# Patient Record
Sex: Male | Born: 1990 | Race: White | Hispanic: No | Marital: Single | Smoking: Current every day smoker
Health system: Southern US, Community
[De-identification: ages and names within clinical notes are randomized; demographics above are authoritative.]

---

## 2015-05-20 ENCOUNTER — Encounter (HOSPITAL_COMMUNITY): Payer: Self-pay

## 2015-05-20 ENCOUNTER — Emergency Department (HOSPITAL_COMMUNITY): Payer: Self-pay

## 2015-05-20 ENCOUNTER — Inpatient Hospital Stay (HOSPITAL_COMMUNITY)
Admission: EM | Admit: 2015-05-20 | Discharge: 2015-05-24 | DRG: 493 | Disposition: A | Discharge status: Home / Self Care | Payer: Self-pay | Attending: Orthopaedic Surgery | Admitting: Orthopaedic Surgery

## 2015-05-20 DIAGNOSIS — S82201A Unspecified fracture of shaft of right tibia, initial encounter for closed fracture: Secondary | ICD-10-CM | POA: Diagnosis present

## 2015-05-20 DIAGNOSIS — S0101XA Laceration without foreign body of scalp, initial encounter: Secondary | ICD-10-CM | POA: Diagnosis present

## 2015-05-20 DIAGNOSIS — S82209B Unspecified fracture of shaft of unspecified tibia, initial encounter for open fracture type I or II: Secondary | ICD-10-CM | POA: Diagnosis present

## 2015-05-20 DIAGNOSIS — S82401A Unspecified fracture of shaft of right fibula, initial encounter for closed fracture: Secondary | ICD-10-CM

## 2015-05-20 DIAGNOSIS — T79A21A Traumatic compartment syndrome of right lower extremity, initial encounter: Secondary | ICD-10-CM | POA: Diagnosis present

## 2015-05-20 DIAGNOSIS — Z880 Allergy status to penicillin: Secondary | ICD-10-CM

## 2015-05-20 DIAGNOSIS — T1490XA Injury, unspecified, initial encounter: Secondary | ICD-10-CM

## 2015-05-20 DIAGNOSIS — S82491A Other fracture of shaft of right fibula, initial encounter for closed fracture: Principal | ICD-10-CM | POA: Diagnosis present

## 2015-05-20 DIAGNOSIS — F17200 Nicotine dependence, unspecified, uncomplicated: Secondary | ICD-10-CM | POA: Diagnosis present

## 2015-05-20 LAB — CBC WITH DIFFERENTIAL/PLATELET
BASOS ABS: 0 10*3/uL (ref 0.0–0.1)
Basophils Relative: 0 %
EOS ABS: 0.4 10*3/uL (ref 0.0–0.7)
Eosinophils Relative: 4 %
HCT: 42.8 % (ref 39.0–52.0)
Hemoglobin: 14.7 g/dL (ref 13.0–17.0)
LYMPHS PCT: 32 %
Lymphs Abs: 3.5 10*3/uL (ref 0.7–4.0)
MCH: 32.1 pg (ref 26.0–34.0)
MCHC: 34.3 g/dL (ref 30.0–36.0)
MCV: 93.4 fL (ref 78.0–100.0)
Monocytes Absolute: 0.9 10*3/uL (ref 0.1–1.0)
Monocytes Relative: 8 %
NEUTROS ABS: 6.2 10*3/uL (ref 1.7–7.7)
NEUTROS PCT: 56 %
PLATELETS: 291 10*3/uL (ref 150–400)
RBC: 4.58 MIL/uL (ref 4.22–5.81)
RDW: 12.1 % (ref 11.5–15.5)
SMEAR REVIEW: ADEQUATE
WBC: 11 10*3/uL — ABNORMAL HIGH (ref 4.0–10.5)

## 2015-05-20 LAB — BASIC METABOLIC PANEL
Anion gap: 11 (ref 5–15)
BUN: 9 mg/dL (ref 6–20)
CALCIUM: 9.1 mg/dL (ref 8.9–10.3)
CO2: 25 mmol/L (ref 22–32)
CREATININE: 1.13 mg/dL (ref 0.61–1.24)
Chloride: 102 mmol/L (ref 101–111)
GFR calc Af Amer: >60 mL/min (ref >60)
Glucose, Bld: 109 mg/dL — ABNORMAL HIGH (ref 65–99)
POTASSIUM: 2.9 mmol/L — AB (ref 3.5–5.1)
SODIUM: 138 mmol/L (ref 135–145)

## 2015-05-20 LAB — PROTIME-INR
INR: 1.13 (ref 0.00–1.49)
PROTHROMBIN TIME: 14.7 s (ref 11.6–15.2)

## 2015-05-20 MED ORDER — MIDAZOLAM HCL 2 MG/2ML IJ SOLN
INTRAMUSCULAR | Status: AC
  Filled 2015-05-20: qty 2

## 2015-05-20 MED ORDER — TETANUS-DIPHTHERIA TOXOIDS TD 5-2 LFU IM INJ
0.5000 mL | INJECTION | Freq: Once | INTRAMUSCULAR | Status: AC
  Administered 2015-05-21: 0.5 mL via INTRAMUSCULAR
  Filled 2015-05-20: qty 0.5

## 2015-05-20 MED ORDER — MORPHINE SULFATE (PF) 4 MG/ML IV SOLN
6.0000 mg | Freq: Once | INTRAVENOUS | Status: AC
  Administered 2015-05-21: 6 mg via INTRAVENOUS
  Filled 2015-05-20: qty 2

## 2015-05-20 MED ORDER — PROPOFOL 10 MG/ML IV BOLUS
INTRAVENOUS | Status: AC
  Filled 2015-05-20: qty 20

## 2015-05-20 MED ORDER — FENTANYL CITRATE (PF) 250 MCG/5ML IJ SOLN
INTRAMUSCULAR | Status: AC
  Filled 2015-05-20: qty 5

## 2015-05-20 NOTE — Progress Notes (Signed)
Orthopedic Tech Progress Note Patient Details:  Hurley CiscoBrandon L Blakley 02/18/1991 [id number] Patient ID: Hurley CiscoBrandon L Eiben, male   DOB: 09/14/1990, 25 y.o.   MRN: [id number]  Level 2 trauma ortho visit. Jennye MoccasinHughes, Shaneil Yazdi Craig 05/20/2015, 10:31 PM
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
## 2015-05-20 NOTE — ED Notes (Signed)
MD at bedside. 
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
## 2015-05-21 ENCOUNTER — Emergency Department (HOSPITAL_COMMUNITY): Payer: Self-pay | Admitting: Certified Registered"

## 2015-05-21 ENCOUNTER — Inpatient Hospital Stay (HOSPITAL_COMMUNITY): Payer: Self-pay

## 2015-05-21 ENCOUNTER — Emergency Department (HOSPITAL_COMMUNITY): Payer: MEDICAID | Admitting: Certified Registered"

## 2015-05-21 ENCOUNTER — Encounter (HOSPITAL_COMMUNITY)
Admission: EM | Disposition: A | Discharge status: Home / Self Care | Payer: Self-pay | Source: Home / Self Care | Attending: Orthopaedic Surgery

## 2015-05-21 ENCOUNTER — Encounter (HOSPITAL_COMMUNITY): Payer: Self-pay | Admitting: Certified Registered"

## 2015-05-21 DIAGNOSIS — S82209B Unspecified fracture of shaft of unspecified tibia, initial encounter for open fracture type I or II: Secondary | ICD-10-CM | POA: Diagnosis present

## 2015-05-21 HISTORY — PX: FASCIOTOMY: SHX132

## 2015-05-21 HISTORY — PX: TIBIA IM NAIL INSERTION: SHX2516

## 2015-05-21 SURGERY — INSERTION, INTRAMEDULLARY ROD, TIBIA
Anesthesia: General | Site: Leg Lower | Laterality: Right

## 2015-05-21 MED ORDER — PROPOFOL 10 MG/ML IV BOLUS
INTRAVENOUS | Status: DC | PRN
  Administered 2015-05-21: 170 mg via INTRAVENOUS

## 2015-05-21 MED ORDER — LIDOCAINE HCL (CARDIAC) 20 MG/ML IV SOLN
INTRAVENOUS | Status: DC | PRN
  Administered 2015-05-21: 50 mg via INTRAVENOUS

## 2015-05-21 MED ORDER — SUCCINYLCHOLINE CHLORIDE 20 MG/ML IJ SOLN
INTRAMUSCULAR | Status: AC
  Filled 2015-05-21: qty 1

## 2015-05-21 MED ORDER — HYDROMORPHONE HCL 1 MG/ML IJ SOLN
INTRAMUSCULAR | Status: AC
  Filled 2015-05-21: qty 1

## 2015-05-21 MED ORDER — FENTANYL CITRATE (PF) 250 MCG/5ML IJ SOLN
INTRAMUSCULAR | Status: DC | PRN
  Administered 2015-05-21: 50 ug via INTRAVENOUS
  Administered 2015-05-21: 100 ug via INTRAVENOUS

## 2015-05-21 MED ORDER — METHOCARBAMOL 500 MG PO TABS
500.0000 mg | ORAL_TABLET | Freq: Four times a day (QID) | ORAL | Status: DC | PRN
  Administered 2015-05-21 – 2015-05-23 (×8): 500 mg via ORAL
  Filled 2015-05-21 (×9): qty 1

## 2015-05-21 MED ORDER — SUCCINYLCHOLINE CHLORIDE 20 MG/ML IJ SOLN
INTRAMUSCULAR | Status: DC | PRN
  Administered 2015-05-21: 160 mg via INTRAVENOUS

## 2015-05-21 MED ORDER — CEFAZOLIN SODIUM-DEXTROSE 2-4 GM/100ML-% IV SOLN: 2.0000 g | Freq: Once | INTRAVENOUS | Status: DC

## 2015-05-21 MED ORDER — HYDROMORPHONE HCL 1 MG/ML IJ SOLN
0.2500 mg | INTRAMUSCULAR | Status: DC | PRN
  Administered 2015-05-21 (×3): 0.5 mg via INTRAVENOUS

## 2015-05-21 MED ORDER — ONDANSETRON HCL 4 MG/2ML IJ SOLN
INTRAMUSCULAR | Status: DC | PRN
  Administered 2015-05-21: 4 mg via INTRAVENOUS

## 2015-05-21 MED ORDER — CLINDAMYCIN PHOSPHATE 900 MG/50ML IV SOLN
INTRAVENOUS | Status: DC | PRN
  Administered 2015-05-21: 900 mg via INTRAVENOUS

## 2015-05-21 MED ORDER — INFLUENZA VAC SPLIT QUAD 0.5 ML IM SUSY
0.5000 mL | PREFILLED_SYRINGE | INTRAMUSCULAR | Status: AC
  Administered 2015-05-24: 0.5 mL via INTRAMUSCULAR
  Filled 2015-05-21: qty 0.5

## 2015-05-21 MED ORDER — 0.9 % SODIUM CHLORIDE (POUR BTL) OPTIME
TOPICAL | Status: DC | PRN
  Administered 2015-05-21: 1000 mL

## 2015-05-21 MED ORDER — SODIUM CHLORIDE 0.9 % IV SOLN
INTRAVENOUS | Status: DC
  Administered 2015-05-21 – 2015-05-23 (×3): via INTRAVENOUS

## 2015-05-21 MED ORDER — ROCURONIUM BROMIDE 50 MG/5ML IV SOLN
INTRAVENOUS | Status: AC
Start: 2015-05-21 — End: 2015-05-21
  Filled 2015-05-21: qty 1

## 2015-05-21 MED ORDER — ACETAMINOPHEN 650 MG RE SUPP: 650.0000 mg | Freq: Four times a day (QID) | RECTAL | Status: DC | PRN

## 2015-05-21 MED ORDER — LACTATED RINGERS IV SOLN
INTRAVENOUS | Status: DC | PRN
  Administered 2015-05-21 (×2): via INTRAVENOUS

## 2015-05-21 MED ORDER — DEXAMETHASONE SODIUM PHOSPHATE 4 MG/ML IJ SOLN
INTRAMUSCULAR | Status: DC | PRN
  Administered 2015-05-21: 4 mg via INTRAVENOUS

## 2015-05-21 MED ORDER — OXYCODONE HCL 5 MG PO TABS
5.0000 mg | ORAL_TABLET | ORAL | Status: DC | PRN
  Administered 2015-05-21 – 2015-05-24 (×16): 10 mg via ORAL
  Filled 2015-05-21 (×16): qty 2

## 2015-05-21 MED ORDER — ACETAMINOPHEN 325 MG PO TABS
650.0000 mg | ORAL_TABLET | Freq: Four times a day (QID) | ORAL | Status: DC | PRN
  Administered 2015-05-22 – 2015-05-23 (×2): 650 mg via ORAL
  Filled 2015-05-21 (×2): qty 2

## 2015-05-21 MED ORDER — METHOCARBAMOL 1000 MG/10ML IJ SOLN: 500.0000 mg | Freq: Four times a day (QID) | INTRAVENOUS | Status: DC | PRN

## 2015-05-21 MED ORDER — ONDANSETRON HCL 4 MG/2ML IJ SOLN: 4.0000 mg | Freq: Once | INTRAMUSCULAR | Status: DC | PRN

## 2015-05-21 MED ORDER — NICOTINE 14 MG/24HR TD PT24
14.0000 mg | MEDICATED_PATCH | Freq: Every day | TRANSDERMAL | Status: DC
  Administered 2015-05-21 – 2015-05-24 (×4): 14 mg via TRANSDERMAL
  Filled 2015-05-21 (×4): qty 1

## 2015-05-21 MED ORDER — LIDOCAINE HCL (CARDIAC) 20 MG/ML IV SOLN
INTRAVENOUS | Status: AC
Start: 2015-05-21 — End: 2015-05-21
  Filled 2015-05-21: qty 5

## 2015-05-21 MED ORDER — CLINDAMYCIN PHOSPHATE 900 MG/50ML IV SOLN
INTRAVENOUS | Status: AC
  Filled 2015-05-21: qty 50

## 2015-05-21 MED ORDER — ONDANSETRON HCL 4 MG/2ML IJ SOLN
INTRAMUSCULAR | Status: AC
  Filled 2015-05-21: qty 2

## 2015-05-21 MED ORDER — DIPHENHYDRAMINE HCL 12.5 MG/5ML PO ELIX
12.5000 mg | ORAL_SOLUTION | ORAL | Status: DC | PRN
  Administered 2015-05-23 – 2015-05-24 (×2): 25 mg via ORAL
  Filled 2015-05-21 (×2): qty 10

## 2015-05-21 MED ORDER — DEXAMETHASONE SODIUM PHOSPHATE 4 MG/ML IJ SOLN
INTRAMUSCULAR | Status: AC
  Filled 2015-05-21: qty 1

## 2015-05-21 MED ORDER — MIDAZOLAM HCL 5 MG/5ML IJ SOLN
INTRAMUSCULAR | Status: DC | PRN
  Administered 2015-05-21: 2 mg via INTRAVENOUS

## 2015-05-21 MED ORDER — CLINDAMYCIN PHOSPHATE 600 MG/50ML IV SOLN
600.0000 mg | Freq: Three times a day (TID) | INTRAVENOUS | Status: AC
  Administered 2015-05-21 – 2015-05-22 (×3): 600 mg via INTRAVENOUS
  Filled 2015-05-21 (×4): qty 50

## 2015-05-21 MED ORDER — HYDROMORPHONE HCL 1 MG/ML IJ SOLN
1.0000 mg | INTRAMUSCULAR | Status: DC | PRN
  Administered 2015-05-21 – 2015-05-23 (×19): 1 mg via INTRAVENOUS
  Filled 2015-05-21 (×20): qty 1

## 2015-05-21 MED ORDER — CLINDAMYCIN PHOSPHATE 900 MG/50ML IV SOLN
900.0000 mg | Freq: Once | INTRAVENOUS | Status: DC
  Filled 2015-05-21: qty 50

## 2015-05-21 MED ORDER — ONDANSETRON HCL 4 MG/2ML IJ SOLN: 4.0000 mg | Freq: Four times a day (QID) | INTRAMUSCULAR | Status: DC | PRN

## 2015-05-21 SURGICAL SUPPLY — 67 items
BANDAGE ELASTIC 4 VELCRO ST LF (GAUZE/BANDAGES/DRESSINGS) ×3 IMPLANT
BANDAGE ELASTIC 6 VELCRO ST LF (GAUZE/BANDAGES/DRESSINGS) ×3 IMPLANT
BIT DRILL 3.8X6 NS (BIT) ×3 IMPLANT
BIT DRILL 4.4 NS (BIT) ×3 IMPLANT
BLADE SURG 10 STRL SS (BLADE) ×3 IMPLANT
BNDG COHESIVE 4X5 TAN STRL (GAUZE/BANDAGES/DRESSINGS) ×3 IMPLANT
BNDG GAUZE ELAST 4 BULKY (GAUZE/BANDAGES/DRESSINGS) ×3 IMPLANT
COVER MAYO STAND STRL (DRAPES) ×3 IMPLANT
COVER SURGICAL LIGHT HANDLE (MISCELLANEOUS) ×6 IMPLANT
CUFF TOURNIQUET SINGLE 34IN LL (TOURNIQUET CUFF) IMPLANT
CUFF TOURNIQUET SINGLE 44IN (TOURNIQUET CUFF) IMPLANT
DRAPE C-ARM 42X72 X-RAY (DRAPES) ×3 IMPLANT
DRAPE IMP U-DRAPE 54X76 (DRAPES) ×3 IMPLANT
DRAPE INCISE IOBAN 66X45 STRL (DRAPES) IMPLANT
DRAPE ORTHO SPLIT 77X108 STRL (DRAPES) ×2
DRAPE PROXIMA HALF (DRAPES) ×3 IMPLANT
DRAPE SURG ORHT 6 SPLT 77X108 (DRAPES) ×1 IMPLANT
DRESSING OPSITE X SMALL 2X3 (GAUZE/BANDAGES/DRESSINGS) ×3 IMPLANT
DURAPREP 26ML APPLICATOR (WOUND CARE) ×3 IMPLANT
ELECT REM PT RETURN 9FT ADLT (ELECTROSURGICAL) ×3
ELECTRODE REM PT RTRN 9FT ADLT (ELECTROSURGICAL) ×1 IMPLANT
GAUZE SPONGE 4X4 12PLY STRL (GAUZE/BANDAGES/DRESSINGS) ×3 IMPLANT
GAUZE XEROFORM 1X8 LF (GAUZE/BANDAGES/DRESSINGS) ×3 IMPLANT
GAUZE XEROFORM 5X9 LF (GAUZE/BANDAGES/DRESSINGS) ×3 IMPLANT
GLOVE BIO SURGEON STRL SZ8 (GLOVE) ×3 IMPLANT
GLOVE BIOGEL PI IND STRL 8 (GLOVE) ×2 IMPLANT
GLOVE BIOGEL PI INDICATOR 8 (GLOVE) ×4
GLOVE ORTHO TXT STRL SZ7.5 (GLOVE) ×3 IMPLANT
GOWN STRL REUS W/ TWL LRG LVL3 (GOWN DISPOSABLE) ×1 IMPLANT
GOWN STRL REUS W/ TWL XL LVL3 (GOWN DISPOSABLE) ×4 IMPLANT
GOWN STRL REUS W/TWL LRG LVL3 (GOWN DISPOSABLE) ×2
GOWN STRL REUS W/TWL XL LVL3 (GOWN DISPOSABLE) ×8
GUIDEPIN 3.2X17.5 THRD DISP (PIN) ×3 IMPLANT
GUIDEWIRE BALL NOSE 80CM (WIRE) ×3 IMPLANT
KIT BASIN OR (CUSTOM PROCEDURE TRAY) ×3 IMPLANT
KIT ROOM TURNOVER OR (KITS) ×3 IMPLANT
MANIFOLD NEPTUNE II (INSTRUMENTS) ×3 IMPLANT
NAIL TIBIAL 9MMX34.5CM (Nail) ×3 IMPLANT
NEEDLE HYPO 21X1.5 SAFETY (NEEDLE) ×3 IMPLANT
NS IRRIG 1000ML POUR BTL (IV SOLUTION) ×3 IMPLANT
PACK ORTHO EXTREMITY (CUSTOM PROCEDURE TRAY) ×3 IMPLANT
PACK UNIVERSAL I (CUSTOM PROCEDURE TRAY) ×3 IMPLANT
PAD ARMBOARD 7.5X6 YLW CONV (MISCELLANEOUS) ×6 IMPLANT
PAD CAST 4YDX4 CTTN HI CHSV (CAST SUPPLIES) ×1 IMPLANT
PADDING CAST COTTON 4X4 STRL (CAST SUPPLIES) ×2
PADDING CAST COTTON 6X4 STRL (CAST SUPPLIES) ×3 IMPLANT
SCREW ACECAP 32MM (Screw) ×3 IMPLANT
SCREW ACECAP 40MM (Screw) ×3 IMPLANT
SCREW PROXIMAL DEPUY (Screw) ×2 IMPLANT
SCREW PRXML FT 50X5.5XLCK NS (Screw) ×1 IMPLANT
SPONGE LAP 18X18 X RAY DECT (DISPOSABLE) ×3 IMPLANT
SPONGE LAP 4X18 X RAY DECT (DISPOSABLE) ×3 IMPLANT
STAPLER VISISTAT 35W (STAPLE) ×3 IMPLANT
STOCKINETTE IMPERVIOUS LG (DRAPES) ×3 IMPLANT
SUT ETHILON 2 0 PSLX (SUTURE) ×6 IMPLANT
SUT VIC AB 0 CT1 27 (SUTURE) ×2
SUT VIC AB 0 CT1 27XBRD ANBCTR (SUTURE) ×1 IMPLANT
SUT VIC AB 0 CTB1 27 (SUTURE) ×3 IMPLANT
SUT VIC AB 2-0 CT1 27 (SUTURE) ×2
SUT VIC AB 2-0 CT1 TAPERPNT 27 (SUTURE) ×1 IMPLANT
SYR CONTROL 10ML LL (SYRINGE) ×3 IMPLANT
TOWEL OR 17X24 6PK STRL BLUE (TOWEL DISPOSABLE) ×3 IMPLANT
TOWEL OR 17X26 10 PK STRL BLUE (TOWEL DISPOSABLE) ×3 IMPLANT
TUBE CONNECTING 12'X1/4 (SUCTIONS) ×1
TUBE CONNECTING 12X1/4 (SUCTIONS) ×2 IMPLANT
WATER STERILE IRR 1000ML POUR (IV SOLUTION) ×3 IMPLANT
YANKAUER SUCT BULB TIP NO VENT (SUCTIONS) ×3 IMPLANT

## 2015-05-21 NOTE — Brief Op Note (Signed)
05/20/2015 - 05/21/2015  2:34 AM  PATIENT:  Marcus Humphrey  25 y.o. male  PRE-OPERATIVE DIAGNOSIS:  open tib-fib fracture, possible compartment syndrome  POST-OPERATIVE DIAGNOSIS:  open tib-fib fracture, possible compartment syndrome  PROCEDURE:  Procedure(s): INTRAMEDULLARY (IM) NAIL RIGHT TIBIAL (Right) FASCIOTOMY (Right)  SURGEON:  Surgeon(s) and Role:    * Kathryne Hitchhristopher Y Adiya Selmer, MD - Primary  PHYSICIAN ASSISTANT: Rexene EdisonGil Clark, PA-C  ANESTHESIA:   general  EBL:  Total I/O In: 1000 [I.V.:1000] Out: 200 [Blood:200]  COUNTS:  YES  TOURNIQUET:  * No tourniquets in log *  DICTATION: .Other Dictation: Dictation Number [id number]  PLAN OF CARE: Admit to inpatient   PATIENT DISPOSITION:  PACU - hemodynamically stable.   Delay start of Pharmacological VTE agent (>24hrs) due to surgical blood loss or risk of bleeding: no
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
## 2015-05-21 NOTE — ED Notes (Signed)
Patient taken to OR.
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
## 2015-05-21 NOTE — Anesthesia Postprocedure Evaluation (Signed)
Anesthesia Post Note  Patient: Marcus Humphrey  Procedure(s) Performed: Procedure(s) (LRB): INTRAMEDULLARY (IM) NAIL RIGHT TIBIAL (Right) FASCIOTOMY (Right)  Patient location during evaluation: PACU Anesthesia Type: General Level of consciousness: awake, awake and alert and oriented Pain management: pain level controlled Vital Signs Assessment: post-procedure vital signs reviewed and stable Respiratory status: spontaneous breathing, nonlabored ventilation and respiratory function stable Cardiovascular status: blood pressure returned to baseline Anesthetic complications: no    Last Vitals:  Filed Vitals:   05/21/15 0354 05/21/15 0500  BP: 181/105 160/106  Pulse: 72 71  Temp: 36.7 C 37.2 C  Resp: 14 14    Last Pain:  Filed Vitals:   05/21/15 0536  PainSc: 7                  [name] COKER

## 2015-05-21 NOTE — Progress Notes (Signed)
Patient ID: Marcus Humphrey, male   DOB: 06/14/1990, 25 y.o.   MRN: [id number] I did review his CT scans of his head and neck and they were both negative for fractures or other acute injuries.  I did remove his C-collar due to him having a normal neck exam as well.

## 2015-05-21 NOTE — Anesthesia Procedure Notes (Signed)
Procedure Name: Intubation Date/Time: 05/21/2015 12:40 AM Performed by: Melina SchoolsBANKS, Mohammad Granade J Pre-anesthesia Checklist: Patient identified, Emergency Drugs available, Suction available, Patient being monitored and Timeout performed Patient Re-evaluated:Patient Re-evaluated prior to inductionOxygen Delivery Method: Circle system utilized Preoxygenation: Pre-oxygenation with 100% oxygen Intubation Type: IV induction, Rapid sequence and Cricoid Pressure applied Laryngoscope Size: Mac and 3 Grade View: Grade II Tube type: Oral Tube size: 7.5 mm Number of attempts: 1 Airway Equipment and Method: Stylet Placement Confirmation: ETT inserted through vocal cords under direct vision,  positive ETCO2 and breath sounds checked- equal and bilateral Secured at: 22 cm Tube secured with: Tape Dental Injury: Teeth and Oropharynx as per pre-operative assessment

## 2015-05-21 NOTE — Progress Notes (Signed)
Subjective: Day of Surgery Procedure(s) (LRB): INTRAMEDULLARY (IM) NAIL RIGHT TIBIAL (Right) FASCIOTOMY (Right) Patient reports pain as moderate.    Objective: Vital signs in last 24 hours: Temp:  [97.9 F (36.6 C)-99.5 F (37.5 C)] 98.9 F (37.2 C) (03/29 0500) Pulse Rate:  [58-109] 71 (03/29 0500) Resp:  [13-14] 14 (03/29 0500) BP: (138-181)/(88-114) 160/106 mmHg (03/29 0500) SpO2:  [97 %-100 %] 99 % (03/29 0500) Weight:  [77.111 kg (170 lb)] 77.111 kg (170 lb) (03/28 2322)  Intake/Output from previous day: 03/28 0701 - 03/29 0700 In: 1700 [I.V.:1700] Out: 200 [Blood:200] Intake/Output this shift:     Recent Labs  05/20/15 2221  HGB 14.7    Recent Labs  05/20/15 2221  WBC 11.0*  RBC 4.58  HCT 42.8  PLT 291    Recent Labs  05/20/15 2221  NA 138  K 2.9*  CL 102  CO2 25  BUN 9  CREATININE 1.13  GLUCOSE 109*  CALCIUM 9.1    Recent Labs  05/20/15 2221  INR 1.13    Sensation intact distally Intact pulses distally Dorsiflexion/Plantar flexion intact Incision: dressing C/D/I Compartment soft VAC with good seal over fasciotomy site.   Assessment/Plan: Day of Surgery Procedure(s) (LRB): INTRAMEDULLARY (IM) NAIL RIGHT TIBIAL (Right) FASCIOTOMY (Right) Up with therapy - 50% weight on left leg Continue VAC - will need repeat I&D and possible closure of fasciotomy wound once swelling subsides (likely late Friday)  Cotey Rakes Y 05/21/2015, 7:15 AM

## 2015-05-21 NOTE — Transfer of Care (Signed)
Immediate Anesthesia Transfer of Care Note  Patient: Marcus Humphrey  Procedure(s) Performed: Procedure(s): INTRAMEDULLARY (IM) NAIL RIGHT TIBIAL (Right) FASCIOTOMY (Right)  Patient Location: PACU  Anesthesia Type:General  Level of Consciousness: awake, oriented, sedated, patient cooperative and responds to stimulation  Airway & Oxygen Therapy: Patient Spontanous Breathing and Patient connected to nasal cannula oxygen  Post-op Assessment: Report given to RN, Post -op Vital signs reviewed and stable and Patient moving all extremities X 4  Post vital signs: Reviewed and stable  Last Vitals:  Filed Vitals:   05/20/15 2345 05/21/15 0000  BP: 153/99 138/91  Pulse: 67 69  Temp:      Complications: No apparent anesthesia complications

## 2015-05-22 ENCOUNTER — Encounter (HOSPITAL_COMMUNITY): Payer: Self-pay | Admitting: Orthopaedic Surgery

## 2015-05-22 MED ORDER — ZOLPIDEM TARTRATE 5 MG PO TABS
10.0000 mg | ORAL_TABLET | Freq: Every evening | ORAL | Status: DC | PRN
  Administered 2015-05-22: 10 mg via ORAL
  Filled 2015-05-22: qty 2

## 2015-05-22 NOTE — Progress Notes (Signed)
Patient ID: Marcus Humphrey, male   DOB: 02/16/1991, 25 y.o.   MRN: [id number] No acute changes.  Will proceed to surgery late tomorrow to try and close his right leg fasciotomy wound vs a possible skin graft.
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
## 2015-05-23 ENCOUNTER — Inpatient Hospital Stay (HOSPITAL_COMMUNITY): Payer: MEDICAID | Admitting: Certified Registered Nurse Anesthetist

## 2015-05-23 ENCOUNTER — Encounter (HOSPITAL_COMMUNITY)
Admission: EM | Disposition: A | Discharge status: Home / Self Care | Payer: Self-pay | Source: Home / Self Care | Attending: Orthopaedic Surgery

## 2015-05-23 HISTORY — PX: I&D EXTREMITY: SHX5045

## 2015-05-23 LAB — SURGICAL PCR SCREEN
MRSA, PCR: NEGATIVE
STAPHYLOCOCCUS AUREUS: NEGATIVE

## 2015-05-23 SURGERY — IRRIGATION AND DEBRIDEMENT EXTREMITY
Anesthesia: General | Site: Leg Lower | Laterality: Right

## 2015-05-23 MED ORDER — SUCCINYLCHOLINE CHLORIDE 20 MG/ML IJ SOLN
INTRAMUSCULAR | Status: AC
  Filled 2015-05-23: qty 1

## 2015-05-23 MED ORDER — 0.9 % SODIUM CHLORIDE (POUR BTL) OPTIME
TOPICAL | Status: DC | PRN
  Administered 2015-05-23: 1000 mL

## 2015-05-23 MED ORDER — METHOCARBAMOL 500 MG PO TABS: 500.0000 mg | ORAL_TABLET | Freq: Four times a day (QID) | ORAL | Status: AC | PRN

## 2015-05-23 MED ORDER — FENTANYL CITRATE (PF) 100 MCG/2ML IJ SOLN
INTRAMUSCULAR | Status: DC | PRN
  Administered 2015-05-23 (×2): 50 ug via INTRAVENOUS

## 2015-05-23 MED ORDER — ONDANSETRON HCL 4 MG/2ML IJ SOLN
INTRAMUSCULAR | Status: AC
  Filled 2015-05-23: qty 2

## 2015-05-23 MED ORDER — LACTATED RINGERS IV SOLN
INTRAVENOUS | Status: DC
  Administered 2015-05-23: 15:00:00 via INTRAVENOUS

## 2015-05-23 MED ORDER — ROCURONIUM BROMIDE 50 MG/5ML IV SOLN
INTRAVENOUS | Status: AC
Start: 2015-05-23 — End: 2015-05-23
  Filled 2015-05-23: qty 1

## 2015-05-23 MED ORDER — PROPOFOL 10 MG/ML IV BOLUS
INTRAVENOUS | Status: DC | PRN
  Administered 2015-05-23: 170 mg via INTRAVENOUS

## 2015-05-23 MED ORDER — LIDOCAINE HCL (CARDIAC) 20 MG/ML IV SOLN
INTRAVENOUS | Status: AC
  Filled 2015-05-23: qty 5

## 2015-05-23 MED ORDER — DEXAMETHASONE SODIUM PHOSPHATE 4 MG/ML IJ SOLN
INTRAMUSCULAR | Status: DC | PRN
  Administered 2015-05-23: 4 mg via INTRAVENOUS

## 2015-05-23 MED ORDER — OXYCODONE HCL 5 MG/5ML PO SOLN
5.0000 mg | Freq: Once | ORAL | Status: DC | PRN
Start: 2015-05-23 — End: 2015-05-23

## 2015-05-23 MED ORDER — HYDROMORPHONE HCL 1 MG/ML IJ SOLN: 0.2500 mg | INTRAMUSCULAR | Status: DC | PRN

## 2015-05-23 MED ORDER — OXYCODONE-ACETAMINOPHEN 5-325 MG PO TABS: 1.0000 | ORAL_TABLET | ORAL | Status: AC | PRN

## 2015-05-23 MED ORDER — DEXAMETHASONE SODIUM PHOSPHATE 4 MG/ML IJ SOLN
INTRAMUSCULAR | Status: AC
Start: 2015-05-23 — End: 2015-05-23
  Filled 2015-05-23: qty 1

## 2015-05-23 MED ORDER — ACETAMINOPHEN 160 MG/5ML PO SOLN
325.0000 mg | ORAL | Status: DC | PRN
  Filled 2015-05-23: qty 20.3

## 2015-05-23 MED ORDER — DEXAMETHASONE SODIUM PHOSPHATE 10 MG/ML IJ SOLN
INTRAMUSCULAR | Status: AC
  Filled 2015-05-23: qty 1

## 2015-05-23 MED ORDER — MIDAZOLAM HCL 5 MG/5ML IJ SOLN
INTRAMUSCULAR | Status: DC | PRN
  Administered 2015-05-23: 2 mg via INTRAVENOUS

## 2015-05-23 MED ORDER — EPHEDRINE SULFATE 50 MG/ML IJ SOLN
INTRAMUSCULAR | Status: AC
  Filled 2015-05-23: qty 1

## 2015-05-23 MED ORDER — SODIUM CHLORIDE 0.9 % IJ SOLN
INTRAMUSCULAR | Status: AC
  Filled 2015-05-23: qty 10

## 2015-05-23 MED ORDER — CLINDAMYCIN PHOSPHATE 900 MG/50ML IV SOLN
900.0000 mg | Freq: Once | INTRAVENOUS | Status: AC
  Administered 2015-05-23: 900 mg via INTRAVENOUS
  Filled 2015-05-23: qty 50

## 2015-05-23 MED ORDER — FENTANYL CITRATE (PF) 250 MCG/5ML IJ SOLN
INTRAMUSCULAR | Status: AC
  Filled 2015-05-23: qty 5

## 2015-05-23 MED ORDER — GLYCOPYRROLATE 0.2 MG/ML IJ SOLN
INTRAMUSCULAR | Status: AC
Start: 2015-05-23 — End: 2015-05-23
  Filled 2015-05-23: qty 1

## 2015-05-23 MED ORDER — MIDAZOLAM HCL 2 MG/2ML IJ SOLN
INTRAMUSCULAR | Status: AC
  Filled 2015-05-23: qty 2

## 2015-05-23 MED ORDER — OXYCODONE HCL 5 MG PO TABS: 5.0000 mg | ORAL_TABLET | Freq: Once | ORAL | Status: DC | PRN

## 2015-05-23 MED ORDER — PHENYLEPHRINE 40 MCG/ML (10ML) SYRINGE FOR IV PUSH (FOR BLOOD PRESSURE SUPPORT)
PREFILLED_SYRINGE | INTRAVENOUS | Status: AC
  Filled 2015-05-23: qty 10

## 2015-05-23 MED ORDER — ACETAMINOPHEN 325 MG PO TABS: 325.0000 mg | ORAL_TABLET | ORAL | Status: DC | PRN

## 2015-05-23 MED ORDER — ONDANSETRON HCL 4 MG/2ML IJ SOLN
INTRAMUSCULAR | Status: DC | PRN
  Administered 2015-05-23: 4 mg via INTRAVENOUS

## 2015-05-23 MED ORDER — LIDOCAINE HCL (CARDIAC) 20 MG/ML IV SOLN
INTRAVENOUS | Status: DC | PRN
  Administered 2015-05-23: 70 mg via INTRAVENOUS

## 2015-05-23 SURGICAL SUPPLY — 67 items
BANDAGE ACE 4X5 VEL STRL LF (GAUZE/BANDAGES/DRESSINGS) ×3 IMPLANT
BANDAGE ACE 6X5 VEL STRL LF (GAUZE/BANDAGES/DRESSINGS) ×3 IMPLANT
BANDAGE ELASTIC 3 VELCRO ST LF (GAUZE/BANDAGES/DRESSINGS) IMPLANT
BLADE SURG 10 STRL SS (BLADE) ×3 IMPLANT
BNDG COHESIVE 1X5 TAN STRL LF (GAUZE/BANDAGES/DRESSINGS) IMPLANT
BNDG COHESIVE 4X5 TAN STRL (GAUZE/BANDAGES/DRESSINGS) ×3 IMPLANT
BNDG COHESIVE 6X5 TAN STRL LF (GAUZE/BANDAGES/DRESSINGS) ×6 IMPLANT
BNDG CONFORM 3 STRL LF (GAUZE/BANDAGES/DRESSINGS) IMPLANT
BNDG GAUZE STRTCH 6 (GAUZE/BANDAGES/DRESSINGS) IMPLANT
CORDS BIPOLAR (ELECTRODE) IMPLANT
COVER SURGICAL LIGHT HANDLE (MISCELLANEOUS) ×3 IMPLANT
CUFF TOURNIQUET SINGLE 24IN (TOURNIQUET CUFF) IMPLANT
CUFF TOURNIQUET SINGLE 34IN LL (TOURNIQUET CUFF) IMPLANT
CUFF TOURNIQUET SINGLE 44IN (TOURNIQUET CUFF) IMPLANT
DRAPE ORTHO SPLIT 77X108 STRL (DRAPES) ×4
DRAPE SURG 17X23 STRL (DRAPES) IMPLANT
DRAPE SURG ORHT 6 SPLT 77X108 (DRAPES) ×2 IMPLANT
DRAPE U-SHAPE 47X51 STRL (DRAPES) ×3 IMPLANT
DRSG PAD ABDOMINAL 8X10 ST (GAUZE/BANDAGES/DRESSINGS) ×9 IMPLANT
DURAPREP 26ML APPLICATOR (WOUND CARE) IMPLANT
ELECT CAUTERY BLADE 6.4 (BLADE) IMPLANT
ELECT REM PT RETURN 9FT ADLT (ELECTROSURGICAL)
ELECTRODE REM PT RTRN 9FT ADLT (ELECTROSURGICAL) IMPLANT
GAUZE SPONGE 4X4 12PLY STRL (GAUZE/BANDAGES/DRESSINGS) ×3 IMPLANT
GAUZE XEROFORM 1X8 LF (GAUZE/BANDAGES/DRESSINGS) ×3 IMPLANT
GAUZE XEROFORM 5X9 LF (GAUZE/BANDAGES/DRESSINGS) IMPLANT
GLOVE BIO SURGEON STRL SZ8 (GLOVE) ×3 IMPLANT
GLOVE BIOGEL PI IND STRL 6.5 (GLOVE) ×1 IMPLANT
GLOVE BIOGEL PI IND STRL 8 (GLOVE) ×2 IMPLANT
GLOVE BIOGEL PI INDICATOR 6.5 (GLOVE) ×2
GLOVE BIOGEL PI INDICATOR 8 (GLOVE) ×4
GLOVE ORTHO TXT STRL SZ7.5 (GLOVE) ×3 IMPLANT
GLOVE SURG SS PI 6.5 STRL IVOR (GLOVE) ×3 IMPLANT
GOWN STRL REUS W/ TWL LRG LVL3 (GOWN DISPOSABLE) ×1 IMPLANT
GOWN STRL REUS W/ TWL XL LVL3 (GOWN DISPOSABLE) ×2 IMPLANT
GOWN STRL REUS W/TWL LRG LVL3 (GOWN DISPOSABLE) ×2
GOWN STRL REUS W/TWL XL LVL3 (GOWN DISPOSABLE) ×4
HANDPIECE INTERPULSE COAX TIP (DISPOSABLE)
KIT BASIN OR (CUSTOM PROCEDURE TRAY) ×3 IMPLANT
KIT ROOM TURNOVER OR (KITS) ×3 IMPLANT
MANIFOLD NEPTUNE II (INSTRUMENTS) ×3 IMPLANT
NS IRRIG 1000ML POUR BTL (IV SOLUTION) ×3 IMPLANT
PACK ORTHO EXTREMITY (CUSTOM PROCEDURE TRAY) ×3 IMPLANT
PAD ARMBOARD 7.5X6 YLW CONV (MISCELLANEOUS) ×6 IMPLANT
PAD CAST 4YDX4 CTTN HI CHSV (CAST SUPPLIES) ×1 IMPLANT
PADDING CAST ABS 4INX4YD NS (CAST SUPPLIES)
PADDING CAST ABS COTTON 4X4 ST (CAST SUPPLIES) IMPLANT
PADDING CAST COTTON 4X4 STRL (CAST SUPPLIES) ×2
PADDING CAST COTTON 6X4 STRL (CAST SUPPLIES) ×3 IMPLANT
SCRUB BETADINE 4OZ XXX (MISCELLANEOUS) ×3 IMPLANT
SET HNDPC FAN SPRY TIP SCT (DISPOSABLE) IMPLANT
SOLUTION BETADINE 4OZ (MISCELLANEOUS) ×3 IMPLANT
SPONGE LAP 18X18 X RAY DECT (DISPOSABLE) ×3 IMPLANT
STOCKINETTE IMPERVIOUS 9X36 MD (GAUZE/BANDAGES/DRESSINGS) ×3 IMPLANT
SUT ETHILON 2 0 FS 18 (SUTURE) IMPLANT
SUT ETHILON 2 0 PSLX (SUTURE) ×18 IMPLANT
SUT ETHILON 3 0 PS 1 (SUTURE) IMPLANT
SYR CONTROL 10ML LL (SYRINGE) IMPLANT
TOWEL OR 17X24 6PK STRL BLUE (TOWEL DISPOSABLE) ×3 IMPLANT
TOWEL OR 17X26 10 PK STRL BLUE (TOWEL DISPOSABLE) ×3 IMPLANT
TUBE ANAEROBIC SPECIMEN COL (MISCELLANEOUS) IMPLANT
TUBE CONNECTING 12'X1/4 (SUCTIONS) ×1
TUBE CONNECTING 12X1/4 (SUCTIONS) ×2 IMPLANT
TUBE FEEDING 5FR 15 INCH (TUBING) IMPLANT
UNDERPAD 30X30 INCONTINENT (UNDERPADS AND DIAPERS) ×3 IMPLANT
WATER STERILE IRR 1000ML POUR (IV SOLUTION) ×3 IMPLANT
YANKAUER SUCT BULB TIP NO VENT (SUCTIONS) ×3 IMPLANT

## 2015-05-23 NOTE — Progress Notes (Signed)
Patient ID: Marcus Humphrey, male   DOB: 04/04/1990, 25 y.o.   MRN: [id number] Marcus Humphrey understands that we are proceeding with surgery today to clean and hopefully close his right leg fasciotomy wound vs a skin graft.

## 2015-05-23 NOTE — Progress Notes (Signed)
Patient ID: Marcus Humphrey, male   DOB: 07/15/1990, 25 y.o.   MRN: [id number] I was able to close his right leg fasciotomy wounds.  He can likely be discharged to home tomorrow.  50% weight bearing on his right leg.
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
## 2015-05-23 NOTE — Discharge Instructions (Signed)
Leave your dressing on your right leg for the next 4-5 days. You can remove all of your dressing in 4-5 days and start getting your incisions wet daily in the shower; then new dry dressing daily as needed. Ice and elevation as needed for swelling. Only 50% of your weight on your right leg until further notice.

## 2015-05-23 NOTE — Anesthesia Postprocedure Evaluation (Signed)
Anesthesia Post Note  Patient: Marcus Humphrey  Procedure(s) Performed: Procedure(s) (LRB): REPEAT IRRIGATION AND DEBRIDEMENT RIGHT LEG WITH WOUND CLOSURE  (Right)  Patient location during evaluation: PACU Anesthesia Type: General Level of consciousness: awake Pain management: pain level controlled Vital Signs Assessment: post-procedure vital signs reviewed and stable Respiratory status: spontaneous breathing and respiratory function stable Cardiovascular status: stable Postop Assessment: no signs of nausea or vomiting Anesthetic complications: no    Last Vitals:  Filed Vitals:   05/23/15 1639 05/23/15 1645  BP: 110/51   Pulse: 73 72  Temp: 37.1 C   Resp: 18 10    Last Pain:  Filed Vitals:   05/23/15 1651  PainSc: 8                  Windsor Zirkelbach

## 2015-05-23 NOTE — Anesthesia Procedure Notes (Signed)
Procedure Name: LMA Insertion Date/Time: 05/23/2015 3:50 PM Performed by: Fabian NovemberSOLHEIM, Marcus Humphrey Pre-anesthesia Checklist: Patient identified, Patient being monitored, Timeout performed, Emergency Drugs available and Suction available Patient Re-evaluated:Patient Re-evaluated prior to inductionOxygen Delivery Method: Circle System Utilized Preoxygenation: Pre-oxygenation with 100% oxygen Intubation Type: IV induction Ventilation: Mask ventilation without difficulty LMA: LMA inserted LMA Size: 5.0 Number of attempts: 1 Placement Confirmation: positive ETCO2 and breath sounds checked- equal and bilateral Tube secured with: Tape Dental Injury: Teeth and Oropharynx as per pre-operative assessment

## 2015-05-23 NOTE — Brief Op Note (Signed)
05/20/2015 - 05/23/2015  4:26 PM  PATIENT:  Hinda GlatterBrandon L Claiborne  25 y.o. male  PRE-OPERATIVE DIAGNOSIS:  Right leg fasciotomy wound  POST-OPERATIVE DIAGNOSIS:  Right leg fasciotomy wound  PROCEDURE:  Procedure(s): REPEAT IRRIGATION AND DEBRIDEMENT RIGHT LEG WITH WOUND CLOSURE  (Right)  SURGEON:  Surgeon(s) and Role:    * Kathryne Hitchhristopher Y Penney Domanski, MD - Primary  PHYSICIAN ASSISTANT: Rexene EdisonGil Clark, PA-C  ANESTHESIA:   general  EBL:  Total I/O In: 2187.5 [I.V.:2187.5] Out: 25 [Drains:25]  COUNTS:  YES  TOURNIQUET:  * No tourniquets in log *  DICTATION: .Other Dictation: Dictation Number [id number]  PLAN OF CARE: Admit to inpatient   PATIENT DISPOSITION:  PACU - hemodynamically stable.   Delay start of Pharmacological VTE agent (>24hrs) due to surgical blood loss or risk of bleeding: no

## 2015-05-23 NOTE — Anesthesia Preprocedure Evaluation (Signed)
Anesthesia Evaluation  Patient identified by MRN, date of birth, ID band Patient awake    Airway Mallampati: I  TM Distance: >3 FB Neck ROM: Full    Dental  (+) Poor Dentition,    Pulmonary neg shortness of breath, neg sleep apnea, neg COPD, neg recent URI, Current Smoker,    breath sounds clear to auscultation       Cardiovascular negative cardio ROS   Rhythm:Regular     Neuro/Psych negative neurological ROS  negative psych ROS   GI/Hepatic negative GI ROS, Neg liver ROS,   Endo/Other  negative endocrine ROS  Renal/GU negative Renal ROS     Musculoskeletal   Abdominal   Peds  Hematology negative hematology ROS (+)   Anesthesia Other Findings   Reproductive/Obstetrics                             Anesthesia Physical Anesthesia Plan  ASA: II  Anesthesia Plan: General   Post-op Pain Management:    Induction: Intravenous  Airway Management Planned: LMA  Additional Equipment: None  Intra-op Plan:   Post-operative Plan: Extubation in OR  Informed Consent: I have reviewed the patients History and Physical, chart, labs and discussed the procedure including the risks, benefits and alternatives for the proposed anesthesia with the patient or authorized representative who has indicated his/her understanding and acceptance.   Dental advisory given  Plan Discussed with: CRNA and Surgeon  Anesthesia Plan Comments:         Anesthesia Quick Evaluation

## 2015-05-23 NOTE — Transfer of Care (Signed)
Immediate Anesthesia Transfer of Care Note  Patient: Marcus Humphrey  Procedure(s) Performed: Procedure(s): REPEAT IRRIGATION AND DEBRIDEMENT RIGHT LEG WITH WOUND CLOSURE  (Right)  Patient Location: PACU  Anesthesia Type:General  Level of Consciousness: sedated  Airway & Oxygen Therapy: Patient Spontanous Breathing and Patient connected to nasal cannula oxygen  Post-op Assessment: Report given to RN and Post -op Vital signs reviewed and stable  Post vital signs: Reviewed and stable  Last Vitals:  Filed Vitals:   05/22/15 2128 05/23/15 0630  BP: 135/77 142/79  Pulse: 86 84  Temp: 37.2 C 37 C  Resp: 16 16    Complications: No apparent anesthesia complications
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
## 2015-05-24 NOTE — Discharge Summary (Signed)
Physician Discharge Summary  Patient ID: Marcus Humphrey MRN: [id number] DOB/AGE: 27/04/1990 27 y.o.  Admit date: 05/20/2015 Discharge date: 05/24/2015  Admission Diagnoses:Open fasciotomy right  Discharge Diagnoses:  Principal Problem:   Closed fracture of right tibia and fibula Active Problems:   Open tibial fracture   Discharged Condition: stable  Hospital Course: Patient's hospital course was essentially unremarkable. He underwent closure of the fasciotomy and was discharged to home in stable condition with home health [facility]  Consults: None  Significant Diagnostic Studies: labs: Routine labs  Treatments: surgery: See operative note  Discharge Exam: Blood pressure 120/59, pulse 63, temperature 98.3 F (36.8 C), temperature source Oral, resp. rate 16, height 6' (1.829 m), weight 77.111 kg (170 lb), SpO2 100 %. Incision/Wound: Dressing clean and dry  Disposition: Final discharge disposition not confirmed  Discharge Instructions    Call MD / Call 911    Complete by:  As directed   If you experience chest pain or shortness of breath, CALL 911 and be transported to the hospital emergency room.  If you develope a fever above 101 F, pus (white drainage) or increased drainage or redness at the wound, or calf pain, call your surgeon's office.     Constipation Prevention    Complete by:  As directed   Drink plenty of fluids.  Prune juice may be helpful.  You may use a stool softener, such as Colace (over the counter) 100 mg twice a day.  Use MiraLax (over the counter) for constipation as needed.     Diet - low sodium heart healthy    Complete by:  As directed      Increase activity slowly as tolerated    Complete by:  As directed             Medication List    TAKE these medications        methocarbamol 500 MG tablet  Commonly known as:  ROBAXIN  Take 1 tablet (500 mg total) by mouth every 6 (six) hours as needed for muscle spasms.     oxyCODONE-acetaminophen 5-325 MG tablet  Commonly known as:  ROXICET  Take 1-2 tablets by mouth every 4 (four) hours as needed.           Follow-up Information    Follow up with Kathryne HitchBLACKMAN,CHRISTOPHER Y, MD. Schedule an appointment as soon as possible for a visit in 2 weeks.   Specialty:  Orthopedic Surgery   Contact information:   [address] [place] [address] [phone]       Signed: Nadara MustardDUDA,Tyresa Prindiville V 05/24/2015, 8:34 AM
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
## 2015-05-24 NOTE — Care Management Note (Signed)
Case Management Note  Patient Details  Name: Marcus Humphrey MRN: [id number] Date of Birth: 03/09/1990  Subjective/Objective:  25 yr old male s/p IM Nailing for right tib/fib fracture                  Action/Plan: Case manager ordered requested DME, patient has no HH therapy needs.    Expected Discharge Date:   05/24/15               Expected Discharge Plan:  Home/Self Care  In-House Referral:     Discharge planning Services  CM Consult  Post Acute Care Choice:  Durable Medical Equipment Choice offered to:  NA  DME Arranged:  3-N-1, Walker rolling DME Agency:  [facility]  HH Arranged:  NA HH Agency:  NA  Status of Service:  Completed, signed off  Medicare Important Message Given:    Date Medicare IM Given:    Medicare IM give by:    Date Additional Medicare IM Given:    Additional Medicare Important Message give by:     If discussed at Long Length of Stay Meetings, dates discussed:    Additional Comments:  Durenda GuthrieBrady, Eliot Popper Naomi, RN 05/24/2015, 9:37 AM

## 2015-05-26 ENCOUNTER — Encounter (HOSPITAL_COMMUNITY): Payer: Self-pay | Admitting: Orthopaedic Surgery

## 2016-04-07 ENCOUNTER — Encounter (HOSPITAL_COMMUNITY): Payer: Self-pay | Admitting: Emergency Medicine

## 2016-04-07 ENCOUNTER — Emergency Department (HOSPITAL_COMMUNITY)
Admission: EM | Admit: 2016-04-07 | Discharge: 2016-04-07 | Disposition: A | Discharge status: Home / Self Care | Payer: Self-pay | Attending: Emergency Medicine | Admitting: Emergency Medicine

## 2016-04-07 ENCOUNTER — Emergency Department (HOSPITAL_COMMUNITY): Payer: Self-pay

## 2016-04-07 DIAGNOSIS — Y999 Unspecified external cause status: Secondary | ICD-10-CM | POA: Insufficient documentation

## 2016-04-07 DIAGNOSIS — M79661 Pain in right lower leg: Secondary | ICD-10-CM | POA: Insufficient documentation

## 2016-04-07 DIAGNOSIS — M79604 Pain in right leg: Secondary | ICD-10-CM

## 2016-04-07 DIAGNOSIS — F17210 Nicotine dependence, cigarettes, uncomplicated: Secondary | ICD-10-CM | POA: Insufficient documentation

## 2016-04-07 DIAGNOSIS — Y9289 Other specified places as the place of occurrence of the external cause: Secondary | ICD-10-CM | POA: Insufficient documentation

## 2016-04-07 DIAGNOSIS — Y9389 Activity, other specified: Secondary | ICD-10-CM | POA: Insufficient documentation

## 2016-04-07 DIAGNOSIS — W228XXA Striking against or struck by other objects, initial encounter: Secondary | ICD-10-CM | POA: Insufficient documentation

## 2016-04-07 MED ORDER — KETOROLAC TROMETHAMINE 30 MG/ML IJ SOLN
30.0000 mg | Freq: Once | INTRAMUSCULAR | Status: AC
  Administered 2016-04-07: 30 mg via INTRAMUSCULAR
  Filled 2016-04-07: qty 1
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
## 2016-04-07 NOTE — Discharge Instructions (Signed)
Alternate between ice and heat, and elevate your leg throughout the day, using ice or heat pack for no more than 20 minutes every hour.  Alternate between tylenol and motrin as needed for pain relief. Follow up with your orthopedic surgeon in the next 5-7 days for recheck and ongoing management of your leg pain. Return to the ER for changes or worsening symptoms.

## 2016-04-07 NOTE — ED Notes (Signed)
Pt verbalized understanding of discharge instructions and denies any further questions at this time.   

## 2016-04-07 NOTE — ED Notes (Signed)
Pt getting dressed and placed on monitor by EMT.
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
## 2017-04-14 IMAGING — RF DG TIBIA/FIBULA 2V*R*
1 series · 6 of 6 positions shown · non-contrast
Comparison: Right tibia/fibula radiographs performed 05/20/2015

CLINICAL DATA: Right tibial intramedullary rod placement. Initial
encounter.

EXAM:
RIGHT TIBIA AND FIBULA - 2 VIEW

[Series 1: run · 6 of 6 slices shown]
[im 1/6]
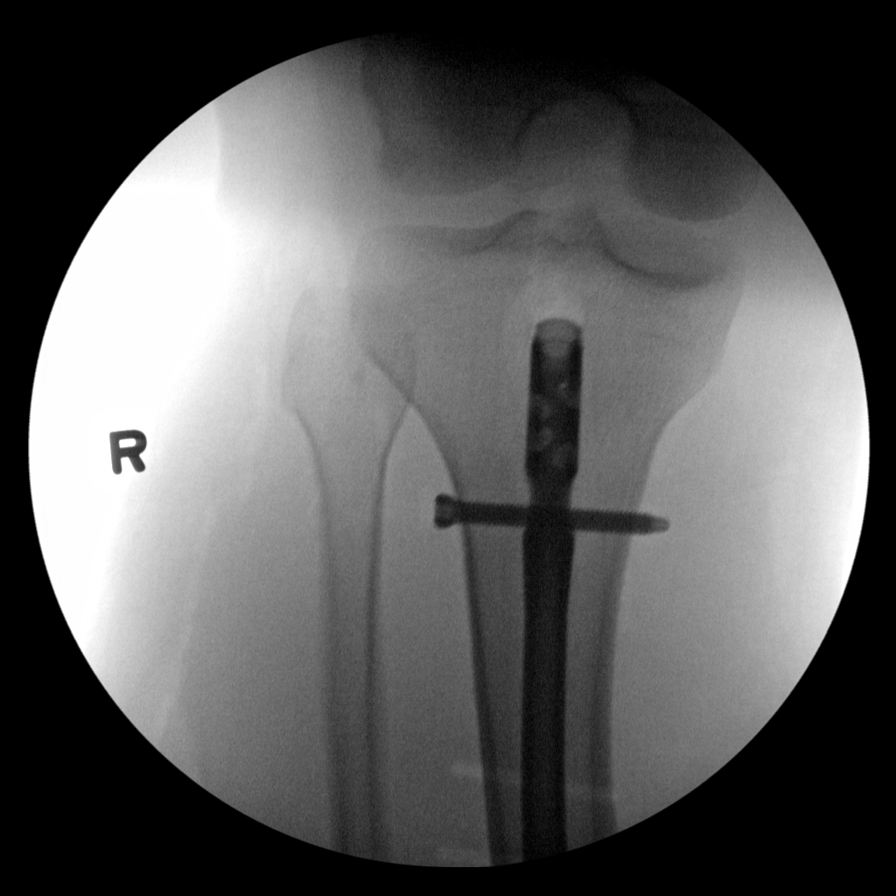
[im 2/6]
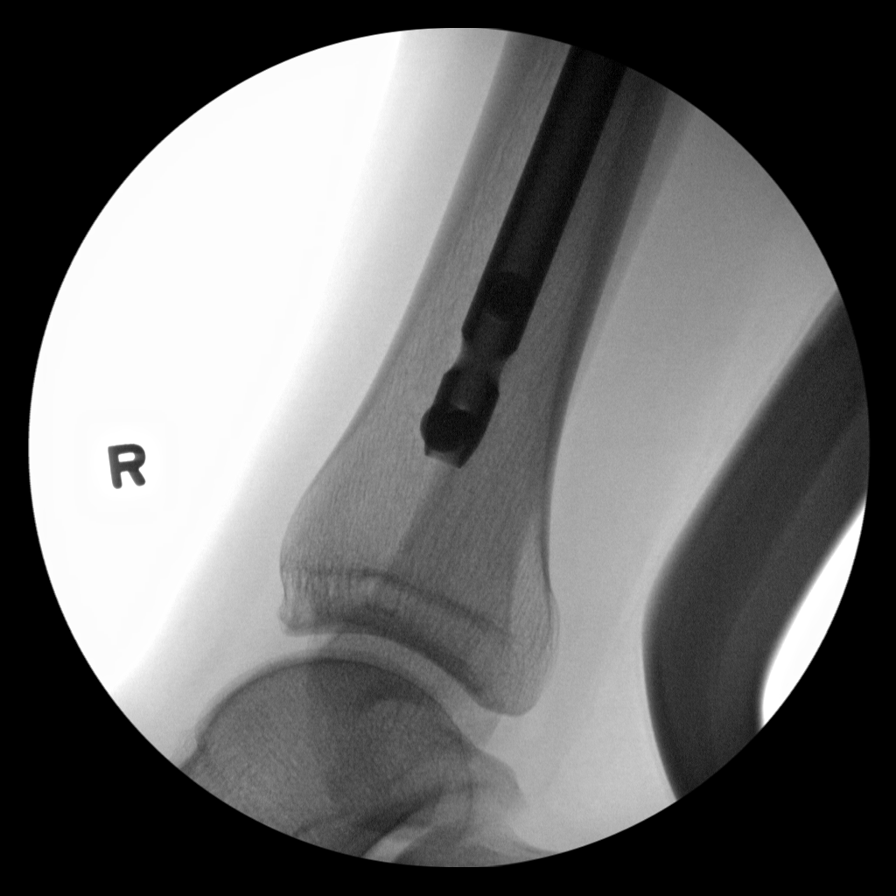
[im 3/6]
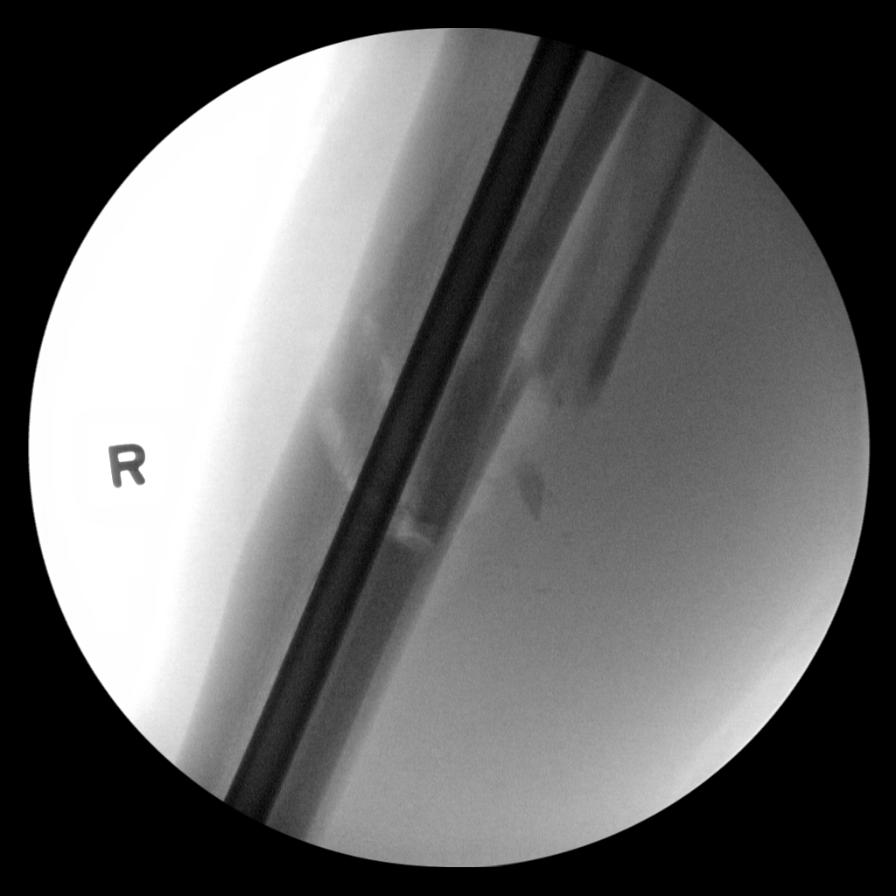
[im 4/6]
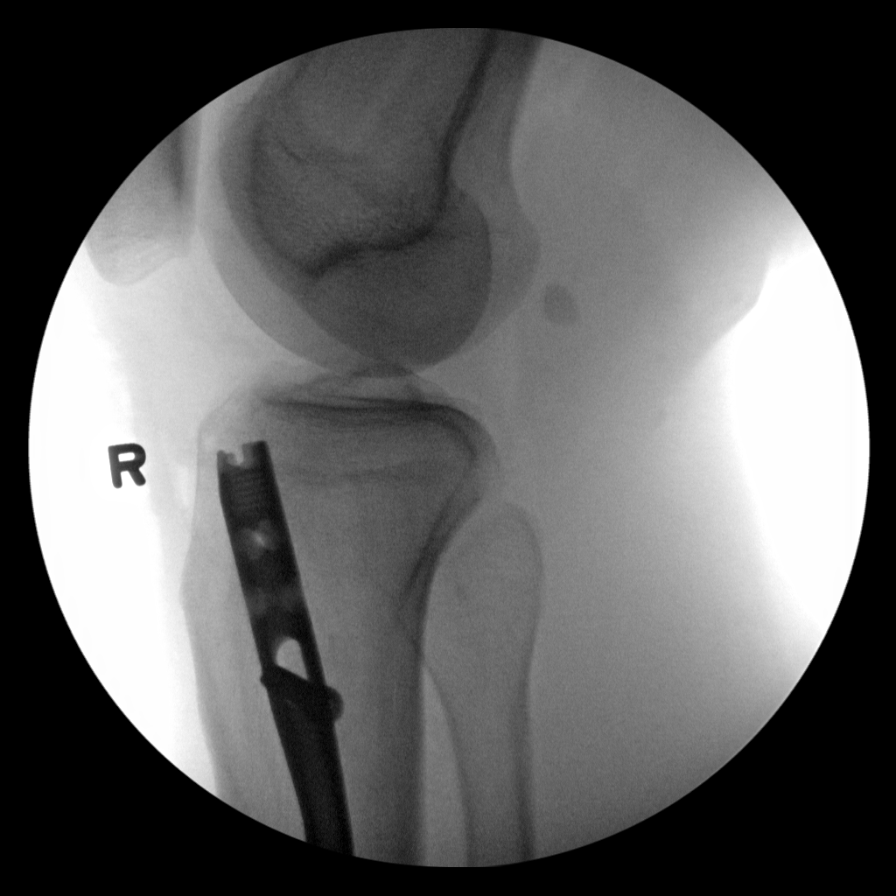
[im 5/6]
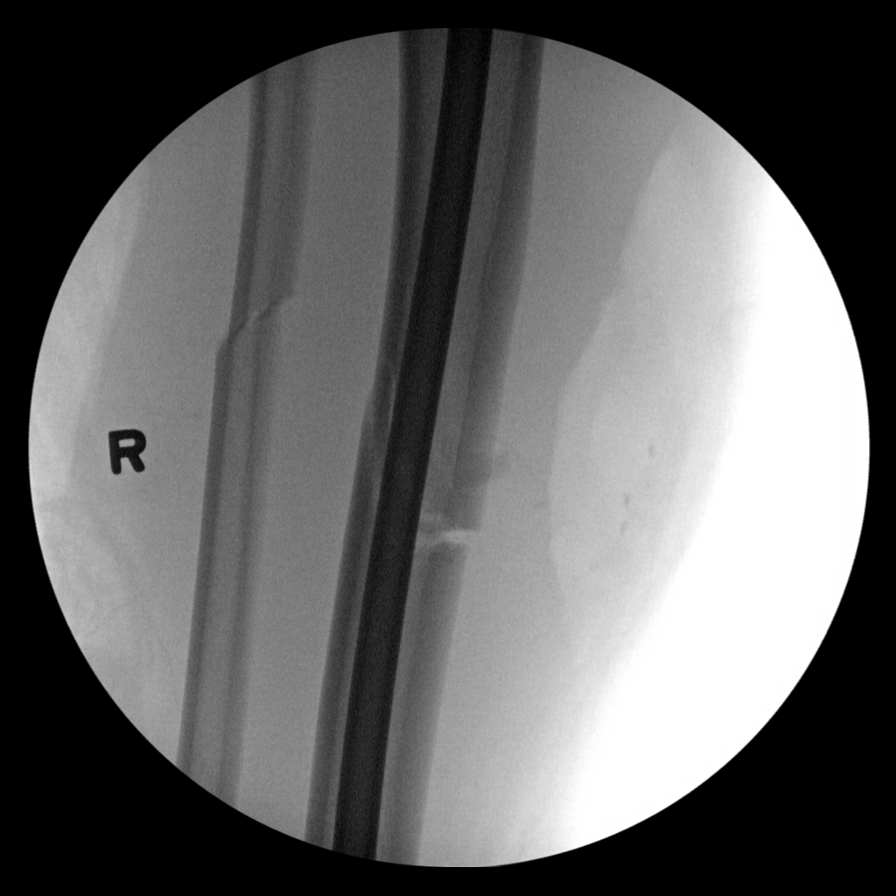
[im 6/6]
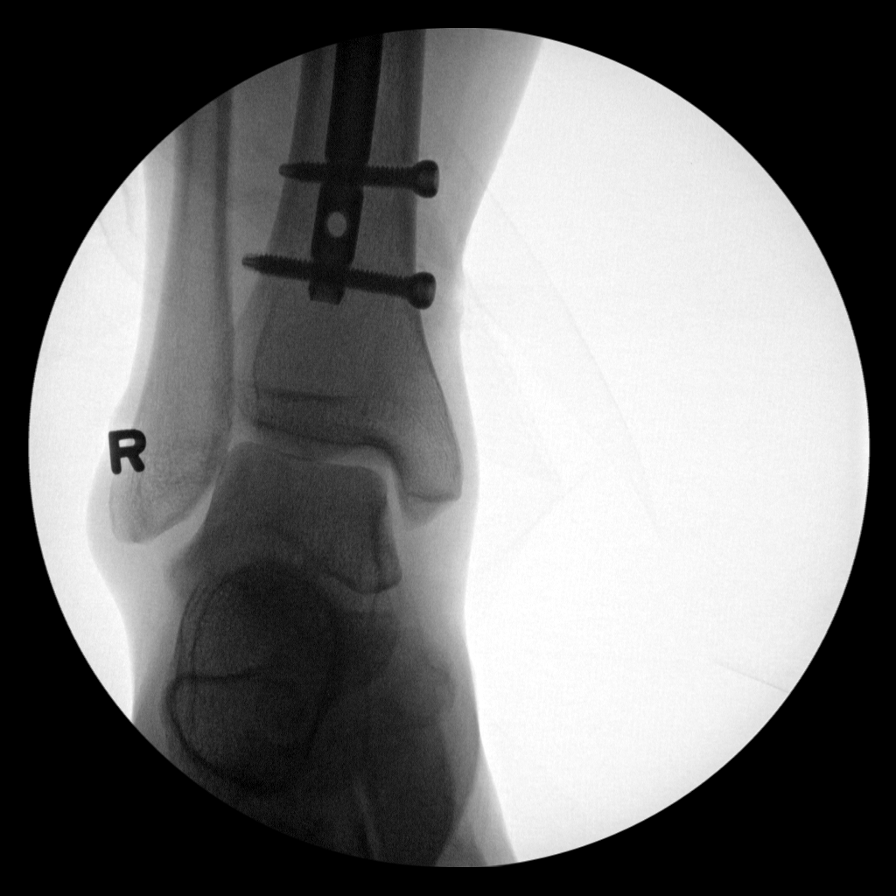

[6 of 6 positions shown; findings below may reference images not displayed]

FINDINGS: Six fluoroscopic C-arm images are provided from the OR. These
demonstrate placement of an intramedullary rod through the tibia,
transfixing the tibial fracture in near anatomic alignment. There is
improved alignment of a mildly displaced mid fibular fracture.
Overlying postoperative soft tissue swelling is noted. No new
fractures are seen.
IMPRESSION: Internal fixation of tibial fracture in near anatomic alignment.
Improved alignment of mid fibular fracture.

## 2018-08-23 DEATH — deceased
